# Patient Record
Sex: Male | Born: 1970 | Race: White | Hispanic: No | Marital: Married | Smoking: Never smoker
Health system: Southern US, Community
[De-identification: ages and names within clinical notes are randomized; demographics above are authoritative.]

---

## 2014-05-21 ENCOUNTER — Encounter: Payer: Self-pay | Admitting: Sports Medicine

## 2014-05-21 ENCOUNTER — Ambulatory Visit (INDEPENDENT_AMBULATORY_CARE_PROVIDER_SITE_OTHER): Payer: Federal, State, Local not specified - PPO | Admitting: Sports Medicine

## 2014-05-21 VITALS — BP 182/110 | Ht 73.0 in | Wt 196.0 lb

## 2014-05-21 DIAGNOSIS — M79609 Pain in unspecified limb: Secondary | ICD-10-CM

## 2014-05-21 DIAGNOSIS — M79604 Pain in right leg: Secondary | ICD-10-CM

## 2014-05-21 NOTE — Patient Instructions (Signed)
DR DAN MURPHY TUES 05/29/14/ @ 3PM ARRIVAL TIME 245PM [address]  [place]  [phone]

## 2014-05-21 NOTE — Progress Notes (Addendum)
   Subjective:    Patient ID: Fritzi Mandes, male    DOB: 02-23-1971, 43 y.o.   MRN: [id number]  Leg Pain    Pleasant 43 y.o. male, new patient, presenting with approximately 7 year history of right calf tightness that has had a relapsing and remitting course. He started distance running 7 years prior and since then has had episodes of sharp tightness in right mid gastrocnemius at varying distances into a run. Initially started out at 4-5 miles into a run with sharp pain that "felt like leg was going to explode", associated with firmness in gastrocnemius that resolves with 30 minutes of activity cessation. This now occurs 0.5 to 1 mile into a run. Occurs while running on graded or flat surfaces. Initially (7 years prior) had paresthesias running down calf to all toes. Has not had that sensation in years which he attributes to prompt cessation of activity. Denies any trauma, no ramp up in activity, no weight change. He denies any symptoms in the contralateral leg. Patient has a history of both calf strain and stress fractures in the same leg and states that his current discomfort is much different in nature to what he's experienced with his previous injuries.   Review of Systems Per HPI    Objective:   Physical Exam  Filed Vitals:   05/21/14 1436  BP: 182/110   Filed Vitals:   05/21/14 1436  Height:  (1.854 m)  Weight: 196 lb (88.905 kg)   Gen: well-developed, well-nourished, NAD, AAOX3 R lower leg: No visible erythema or swelling. Range of motion is full in all directions. Strength is 5/5 in all directions. Stable lateral and medial ligaments at knee and ankle; squeeze test and kleiger test unremarkable; Painless dorsilfexion No significant tenderness to palpation at the calf Talar dome nontender; No tenderness on posterior aspects of lateral and medial malleolus No sign of peroneal tendon subluxations; Patient runs without a limp Negative tarsal tunnel tinel's Able to  ambulate without pain  Pronation noted on dynamic gait testing     Assessment & Plan:  Grantley was seen today for leg pain.  Diagnoses and associated orders for this visit:  1. 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 It is the deep posterior compartment involvement that may be involved. - Will refer to Dr. Richardson Landry for further workup and treatment.  2. Pes Planus - Provide scaphoid lifts on green inserts  3. Elevated BP Has been addressed by PMD prior, not on medications, BP repeated in office, patient asymptomatic - Has follow-up with PMD in 2 days, advised to bring up this issue then   Joseph Berkshire, MD [facility] PGY-3

## 2014-06-06 ENCOUNTER — Other Ambulatory Visit: Payer: Self-pay | Admitting: Orthopedic Surgery

## 2014-06-06 DIAGNOSIS — R52 Pain, unspecified: Secondary | ICD-10-CM

## 2014-06-11 ENCOUNTER — Ambulatory Visit
Admission: RE | Admit: 2014-06-11 | Discharge: 2014-06-11 | Disposition: A | Discharge status: Home / Self Care | Payer: Federal, State, Local not specified - PPO | Source: Ambulatory Visit | Attending: Orthopedic Surgery | Admitting: Orthopedic Surgery

## 2014-06-11 DIAGNOSIS — R52 Pain, unspecified: Secondary | ICD-10-CM

## 2014-06-13 ENCOUNTER — Ambulatory Visit
Admission: RE | Admit: 2014-06-13 | Discharge: 2014-06-13 | Disposition: A | Discharge status: Home / Self Care | Payer: Federal, State, Local not specified - PPO | Source: Ambulatory Visit | Attending: Orthopedic Surgery | Admitting: Orthopedic Surgery

## 2014-06-13 DIAGNOSIS — R52 Pain, unspecified: Secondary | ICD-10-CM

## 2015-02-03 IMAGING — US US EXTREM LOW VENOUS BILAT
1 series · 13 of 24 positions shown · non-contrast
Comparison: None.

CLINICAL DATA: Bilateral lower extremity pain and edema, right
greater than left. Evaluate for DVT.



[Series 1: us extrem low venous bilat · 13 of 73 slices shown]
[im 1/73]
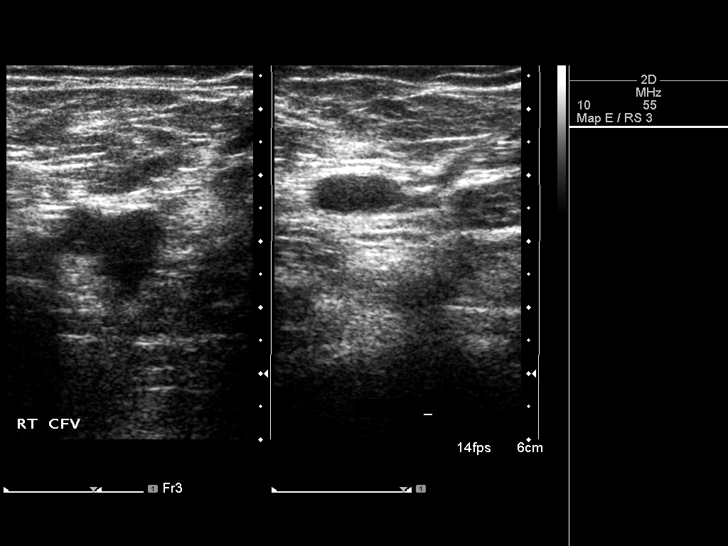
[im 7/73]
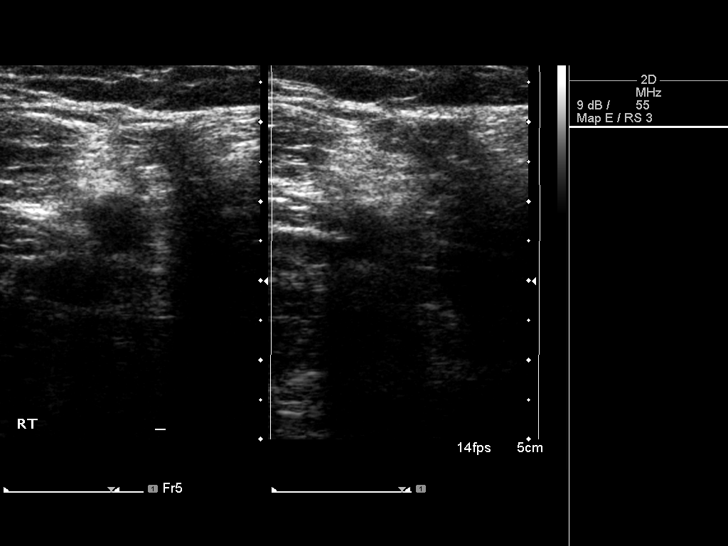
[im 13/73]
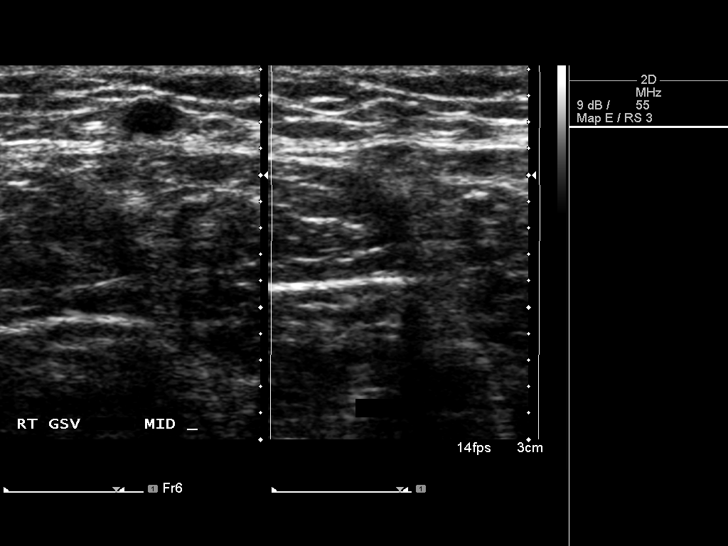
[im 19/73]
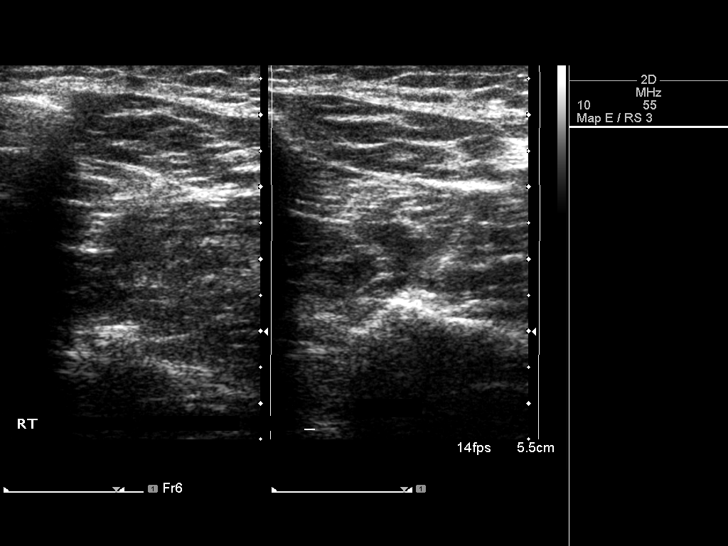
[im 26/73]
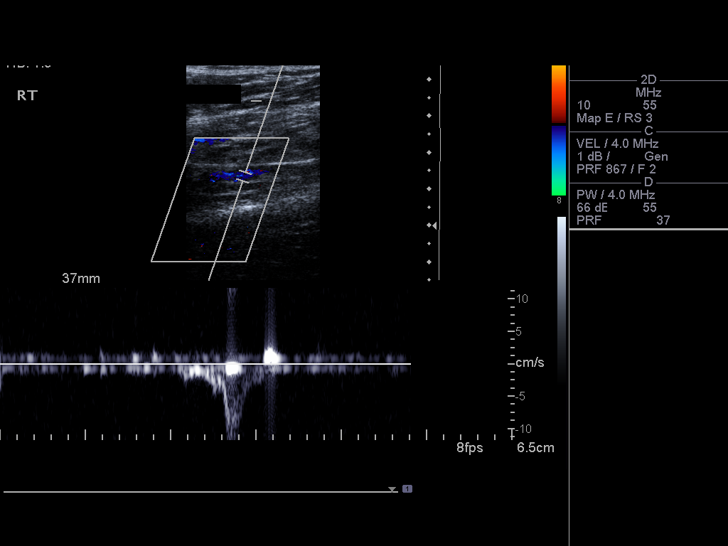
[im 32/73]
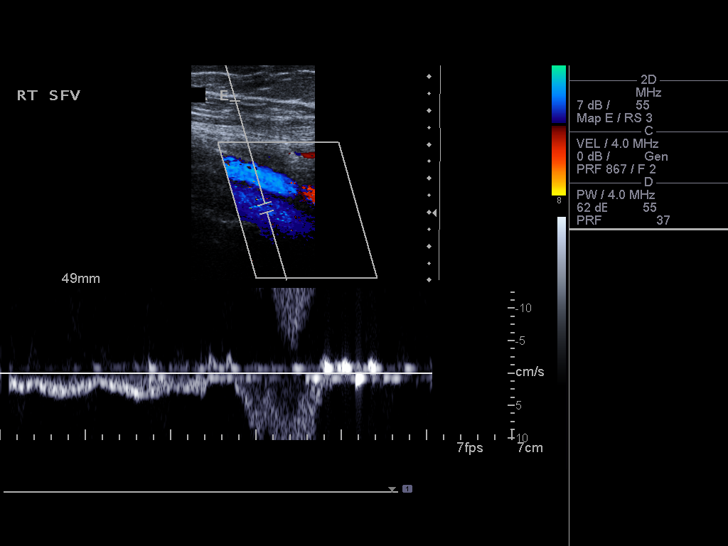
[im 38/73]
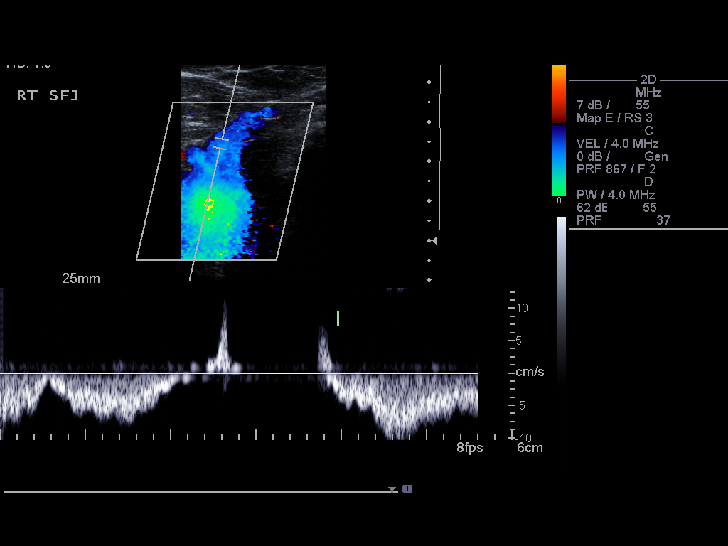
[im 41/73]
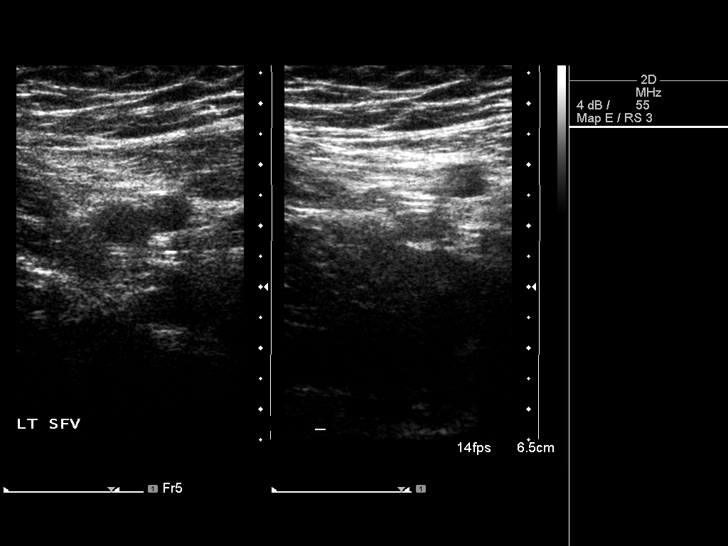
[im 47/73]
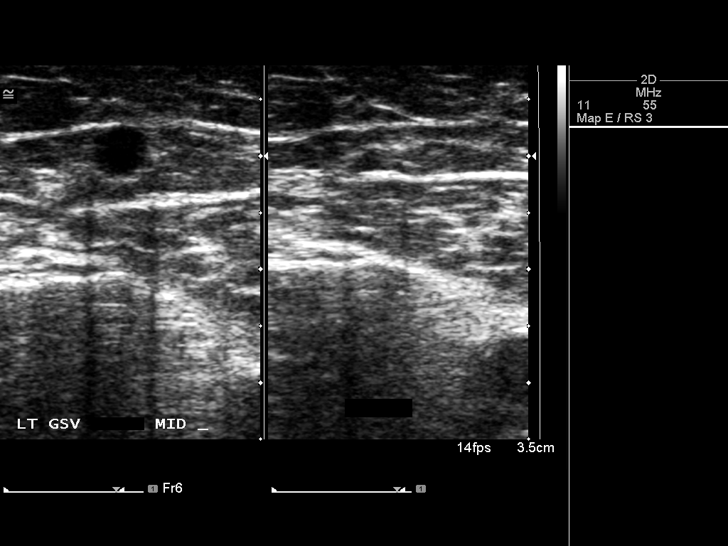
[im 54/73]
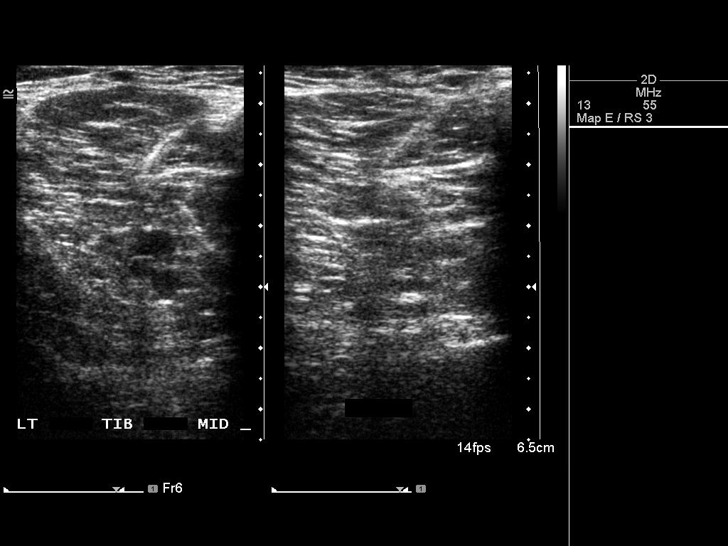
[im 60/73]
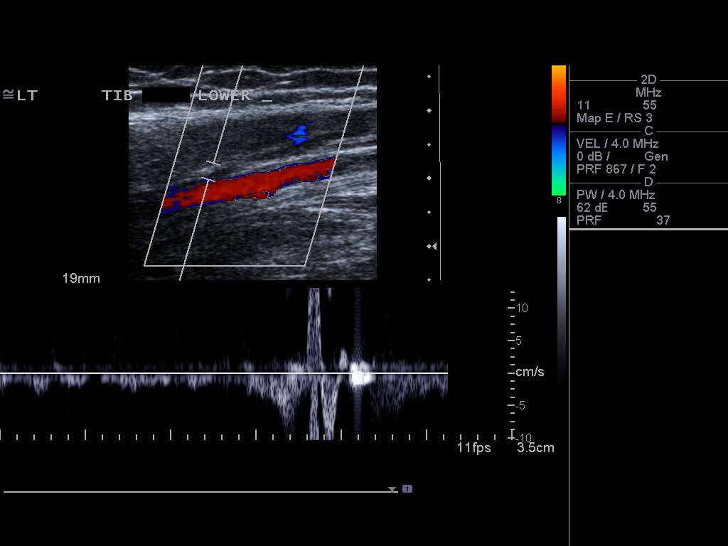
[im 66/73]
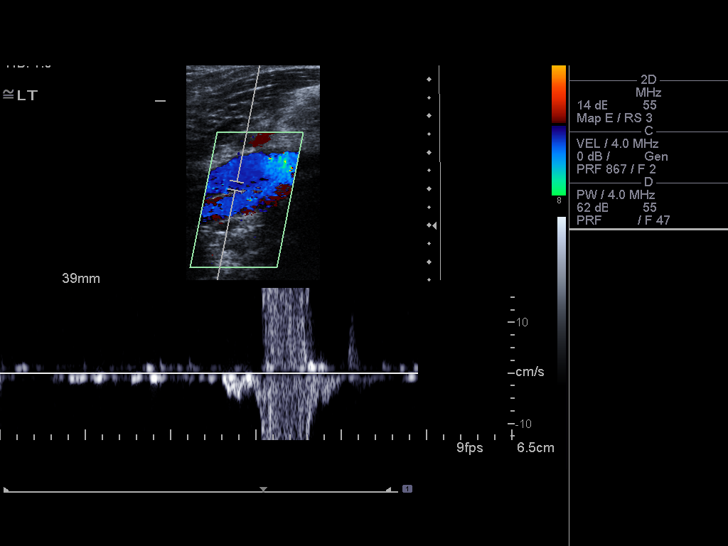
[im 73/73]
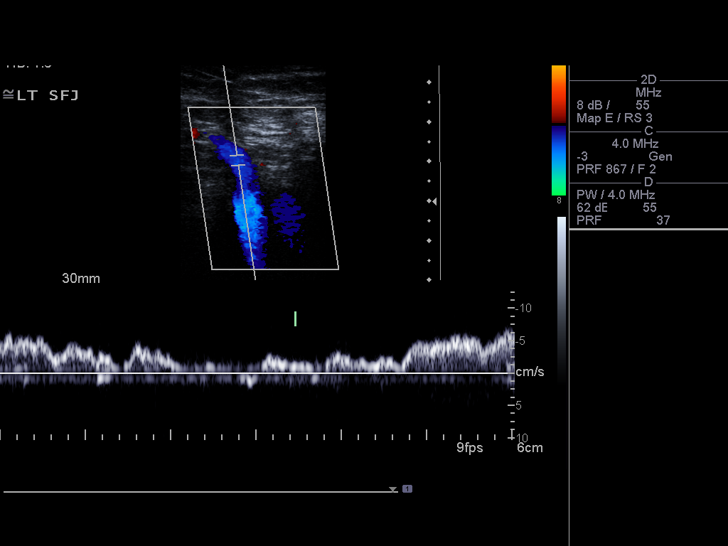

[13 of 24 positions shown; findings below may reference images not displayed]

FINDINGS: RIGHT LOWER EXTREMITY

Common Femoral Vein: No evidence of thrombus. Normal
compressibility, respiratory phasicity and response to augmentation.

Saphenofemoral Junction: No evidence of thrombus. Normal
compressibility and flow on color Doppler imaging.

Profunda Femoral Vein: No evidence of thrombus. Normal
compressibility and flow on color Doppler imaging.

Femoral Vein: No evidence of thrombus. Normal compressibility,
respiratory phasicity and response to augmentation.

Popliteal Vein: No evidence of thrombus. Normal compressibility,
respiratory phasicity and response to augmentation.

Calf Veins: No evidence of thrombus. Normal compressibility and flow
on color Doppler imaging.

Superficial Great Saphenous Vein: No evidence of thrombus. Normal
compressibility and flow on color Doppler imaging.

Venous Reflux:  None.

Other Findings:  None.

LEFT LOWER EXTREMITY

Common Femoral Vein: No evidence of thrombus. Normal
compressibility, respiratory phasicity and response to augmentation.

Saphenofemoral Junction: No evidence of thrombus. Normal
compressibility and flow on color Doppler imaging.

Profunda Femoral Vein: No evidence of thrombus. Normal
compressibility and flow on color Doppler imaging.

Femoral Vein: No evidence of thrombus. Normal compressibility,
respiratory phasicity and response to augmentation.

Popliteal Vein: No evidence of thrombus. Normal compressibility,
respiratory phasicity and response to augmentation.

Calf Veins: No evidence of thrombus. Normal compressibility and flow
on color Doppler imaging.

Superficial Great Saphenous Vein: No evidence of thrombus. Normal
compressibility and flow on color Doppler imaging.

Venous Reflux:  None.

Other Findings:  None.
IMPRESSION: No evidence of DVT within either lower extremity.
